# Patient Record
Sex: Female | Born: 1995 | Race: Black or African American | Hispanic: No | Marital: Single | Smoking: Never smoker
Health system: Southern US, Community
[De-identification: ages and names within clinical notes are randomized; demographics above are authoritative.]

## PROBLEM LIST (undated history)

## (undated) HISTORY — PX: TONSILLECTOMY: SUR1361

---

## 2016-07-23 ENCOUNTER — Emergency Department (HOSPITAL_COMMUNITY)
Admission: EM | Admit: 2016-07-23 | Discharge: 2016-07-23 | Disposition: A | Discharge status: Home / Self Care | Payer: Medicaid Other | Attending: Emergency Medicine | Admitting: Emergency Medicine

## 2016-07-23 ENCOUNTER — Encounter (HOSPITAL_COMMUNITY): Payer: Self-pay

## 2016-07-23 DIAGNOSIS — J029 Acute pharyngitis, unspecified: Secondary | ICD-10-CM | POA: Insufficient documentation

## 2016-07-23 LAB — RAPID STREP SCREEN (MED CTR MEBANE ONLY): Streptococcus, Group A Screen (Direct): NEGATIVE

## 2016-07-23 MED ORDER — CLINDAMYCIN HCL 300 MG PO CAPS: 300.0000 mg | ORAL_CAPSULE | Freq: Three times a day (TID) | ORAL | 0 refills | Status: DC

## 2016-07-23 NOTE — ED Provider Notes (Signed)
  [facility]-EMERGENCY DEPT Provider Note   CSN: [id number] Arrival date & time: 07/23/16  1458     History   Chief Complaint Chief Complaint  Patient presents with  . Sore Throat    HPI Andrea Hamilton is a 21 y.o. female.  HPI Pt presents with 24 hours of worsening sorethroat without fever. No vomiting. Recent viral URI. No neck pain or dental pain. Pain is moderate in severity.     History reviewed. No pertinent past medical history.  There are no active problems to display for this patient.   History reviewed. No pertinent surgical history.  OB History    No data available       Home Medications    Prior to Admission medications   Medication Sig Start Date End Date Taking? Authorizing Provider  clindamycin (CLEOCIN) 300 MG capsule Take 1 capsule (300 mg total) by mouth 3 (three) times daily. 07/23/16   Azalia BilisKevin Brylan Seubert, MD    Family History No family history on file.  Social History Social History  Substance Use Topics  . Smoking status: Never Smoker  . Smokeless tobacco: Never Used  . Alcohol use No     Allergies   Patient has no allergy information on record.   Review of Systems Review of Systems  All other systems reviewed and are negative.    Physical Exam Updated Vital Signs BP 129/88 (BP Location: Left Arm)   Pulse 67   Temp 98.7 F (37.1 C) (Oral)   Resp 16   SpO2 100%   Physical Exam  Constitutional: She is oriented to person, place, and time. She appears well-developed and well-nourished.  HENT:  Head: Normocephalic.  Posterior pharynx erythema with tonsillar swelling and exudate. No signs of PTA. Tolerating secretions. Speech normal  Eyes: EOM are normal.  Neck: Normal range of motion.  Pulmonary/Chest: Effort normal.  Abdominal: She exhibits no distension.  Musculoskeletal: Normal range of motion.  Neurological: She is alert and oriented to person, place, and time.  Psychiatric: She has a normal mood and affect.  Nursing note and  vitals reviewed.    ED Treatments / Results  Labs (all labs ordered are listed, but only abnormal results are displayed) Labs Reviewed  RAPID STREP [facility]CREEN (NOT [facility])  CULTURE, GROUP A STREP [facility])    EKG  EKG Interpretation None       Radiology No results found.  Procedures Procedures (including critical care time)  Medications Ordered in ED Medications - No data to display   Initial Impression / Assessment and Plan / ED Course  I have reviewed the triage vital signs and the nursing notes.  Pertinent labs & imaging results that were available during my care of the patient were reviewed by me and considered in my medical decision making (see chart for details).     Acute tonsillitis. Home with clindamycin. Nsaids. Fluids. No signs of PTA  Final Clinical Impressions(s) / ED Diagnoses   Final diagnoses:  Pharyngitis, unspecified etiology    New Prescriptions New Prescriptions   CLINDAMYCIN (CLEOCIN) 300 MG CAPSULE    Take 1 capsule (300 mg total) by mouth 3 (three) times daily.     Azalia BilisKevin Inza Mikrut, MD 07/23/16 [phone]

## 2016-07-23 NOTE — ED Triage Notes (Addendum)
Pt reports sore throat and states she has white patches in the back of her throat. Tonsils appear enlarged. She also reports pain with swallowing food; onset yesterday.

## 2016-07-26 LAB — CULTURE, GROUP A STREP (THRC)

## 2016-11-22 DIAGNOSIS — J3501 Chronic tonsillitis: Secondary | ICD-10-CM | POA: Insufficient documentation

## 2016-11-22 DIAGNOSIS — J0391 Acute recurrent tonsillitis, unspecified: Secondary | ICD-10-CM | POA: Insufficient documentation

## 2017-04-13 ENCOUNTER — Encounter (HOSPITAL_COMMUNITY): Payer: Self-pay

## 2017-04-13 ENCOUNTER — Other Ambulatory Visit: Payer: Self-pay

## 2017-04-13 ENCOUNTER — Emergency Department (HOSPITAL_COMMUNITY)
Admission: EM | Admit: 2017-04-13 | Discharge: 2017-04-13 | Disposition: A | Discharge status: Home / Self Care | Payer: Self-pay | Attending: Emergency Medicine | Admitting: Emergency Medicine

## 2017-04-13 DIAGNOSIS — M25512 Pain in left shoulder: Secondary | ICD-10-CM | POA: Insufficient documentation

## 2017-04-13 MED ORDER — CYCLOBENZAPRINE HCL 10 MG PO TABS: 10.0000 mg | ORAL_TABLET | Freq: Every evening | ORAL | 0 refills | Status: DC | PRN

## 2017-04-13 MED ORDER — NAPROXEN 500 MG PO TABS: 500.0000 mg | ORAL_TABLET | Freq: Two times a day (BID) | ORAL | 0 refills | Status: DC

## 2017-04-13 NOTE — ED Triage Notes (Signed)
Pt reports she was exercise last night and has had left should pain since stating "I think I pulled something".

## 2017-04-13 NOTE — Discharge Instructions (Signed)
Please read and follow all provided instructions.  You have been seen today for left shoulder pain  Tests performed today include: Vital signs. See below for your results today.   Exam was concerning for rotator cuff tendinitis versus biceps tendinitis.  These follow below instructions. Home care instructions: -- *PRICE in the first 24-48 hours after injury: Protect (with brace, splint, sling), if given by your provider -these take your shoulder out of the sling at least once per day and perform shoulder range of motion exercises.  This is to prevent stiffening of the shoulder. Rest Ice- Do not apply ice pack directly to your skin, place towel or similar between your skin and ice/ice pack. Apply ice for 20 min, then remove for 40 min while awake Compression- Wear brace, elastic bandage, splint as directed by your provider Elevate affected extremity above the level of your heart when not walking around for the first 24-48 hours   Use naproxen 500 mg twice daily with food for pain.  He can take muscle relaxers as prescribed at night.  This medication can cause you to become drowsy.  Do not drive or operate heavy machinery while taking this medication.  Follow-up instructions: Please follow-up with your primary care provider or the provided orthopedic physician (bone specialist) if you continue to have significant pain in 1 week. In this case you may have a more severe injury that requires further care.   Return instructions:  Please return if your toes or feet are numb or tingling, appear gray or blue, or you have severe pain (also elevate the leg and loosen splint or wrap if you were given one) Please return to the Emergency Department if you experience worsening symptoms.  Please return if you have any other emergent concerns. Additional Information:  Your vital signs today were: BP (!) 149/101 (BP Location: Right Arm)    Pulse 93    Temp 98.3 F (36.8 C) (Oral)    Resp 16    Ht 5\' 5"   (1.651 m)    Wt 68 kg (150 lb)    SpO2 100%    BMI 24.96 kg/m  If your blood pressure (BP) was elevated above 135/85 this visit, please have this repeated by your doctor within one month. ---------------

## 2017-04-13 NOTE — ED Provider Notes (Signed)
[facility]   CSN: [id number] Arrival date & time: 04/13/17  1029     History   Chief Complaint Chief Complaint  Patient presents with  . Shoulder Pain    HPI Andrea Hamilton is a 21 y.o. female with no significant past medical history who presents to the emergency department today for left shoulder pain.  Patient states that she was at the gym yesterday performing bicep curls, shoulder press and bent over rows.  About an hour after the gym she started noticing pain and soreness around the anterior and posterior left shoulder without radiation.  She denies any neck pain.  Since then the patient has had pain with movement of the arm and also when lying on the left shoulder.  She denies any trauma at the gym or prior to the onset of pain.  She denies any weakness, numbness or tingling.  She has not taken anything for this.  No history of past shoulder injuries or surgeries.    HPI  History reviewed. No pertinent past medical history.  There are no active problems to display for this patient.   Past Surgical History:  Procedure Laterality Date  . TONSILLECTOMY      OB History    No data available       Home Medications    Prior to Admission medications   Medication Sig Start Date End Date Taking? Authorizing Provider  clindamycin (CLEOCIN) 300 MG capsule Take 1 capsule (300 mg total) by mouth 3 (three) times daily. 07/23/16   Azalia Bilisampos, Kevin, MD    Family History No family history on file.  Social History Social History   Tobacco Use  . Smoking status: Never Smoker  . Smokeless tobacco: Never Used  Substance Use Topics  . Alcohol use: No  . Drug use: No     Allergies   Patient has no allergy information on record.   Review of Systems Review of Systems  Constitutional: Negative for chills and fever.  Musculoskeletal: Positive for arthralgias. Negative for joint swelling and neck pain.  Skin: Negative for  wound.  Neurological: Negative for weakness and numbness.     Physical Exam Updated Vital Signs BP (!) 149/101 (BP Location: Right Arm)   Pulse 93   Temp 98.3 F (36.8 C) (Oral)   Resp 16   Ht 5\' 5"  (1.651 m)   Wt 68 kg (150 lb)   SpO2 100%   BMI 24.96 kg/m   Physical Exam  Constitutional: She appears well-developed and well-nourished.  HENT:  Head: Normocephalic and atraumatic.  Right Ear: External ear normal.  Left Ear: External ear normal.  Eyes: Conjunctivae are normal. Right eye exhibits no discharge. Left eye exhibits no discharge. No scleral icterus.  Pulmonary/Chest: Effort normal. No respiratory distress.  Musculoskeletal:  Cervical Spine: Appearance normal. No obvious bony deformity. No skin swelling, erythema, heat, fluctuance or break of the skin. No TTP over the cervical spinous processes. No paraspinal tenderness. No step-offs. Patient is able to actively rotate their neck 45 degrees left and right voluntarily without pain and flex and extend the neck without pain.  Negative Spurling's and Cervical Load test.  Left Shoulder: Appearance normal. No obvious bony deformity. No skin swelling, erythema, heat, fluctuance or break of the skin. No clavicular deformity or TTP. TTP over proximal biceps tendon/bicipital groove, posterior deltoid. Active flexion to 90 limited 2/2 to pain, abduction to 90 with limitation 2/2 to pain. Intact int/ext rotation. Normal extension.  Patient with pain during strength testing for flexion, extension, abduction, adduction, and internal/external rotation intact and appropriate for age. Positive speed's test. Negative drop arms test. Left  Elbow: Appearance normal. No obvious bony deformity. No skin swelling, erythema, heat, fluctuance or break of the skin. No TTP over joint. Active flexion, extension, supination and pronation full and intact without pain. Strength able and appropriate for age for flexion and extension.  Radial Pulse 2+. Cap refill  <2 seconds. SILT for M/U/R distributions. Compartments soft.   Neurological: She is alert.  Skin: No pallor.  Psychiatric: She has a normal mood and affect.  Nursing note and vitals reviewed.    ED Treatments / Results  Labs (all labs ordered are listed, but only abnormal results are displayed) Labs Reviewed - No data to display  EKG  EKG Interpretation None       Radiology No results found.  Procedures Procedures (including critical care time) SPLINT APPLICATION Date/Time: 12:59 PM Authorized by: Jacinto HalimMichael M Maczis Consent: Verbal consent obtained. Risks and benefits: risks, benefits and alternatives were discussed Consent given by: patient Splint applied by: orthopedic technician Location details: Left shoulder Splint type: Shoulder sling Supplies used: shoulder sling Post-procedure: The splinted body part was neurovascularly unchanged following the procedure. Patient tolerance: Patient tolerated the procedure well with no immediate complications.    Medications Ordered in ED Medications - No data to display   Initial Impression / Assessment and Plan / ED Course  I have reviewed the triage vital signs and the nursing notes.  Pertinent labs & imaging results that were available during my care of the patient were reviewed by me and considered in my medical decision making (see chart for details).     21 year old left hand dominant female with shoulder pain after performing lifting exercise in the gym yesterday.  Exam consistent with a bicep tendinitis versus rotator cuff tendinitis.  Do not feel x-ray is warranted at this time.  Will have the patient wear shoulder sling, follow-up price therapy and take NSAIDs/muscle relaxers.  She is to take her arm out of the shoulder sling multiple times per day and perform shoulder range of motion exercises in order to prevent frozen shoulder.  She is to follow-up with PCP versus orthopedics in 1 week for continued pain.  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  Patient given strict return precautions.  She is in agreement with plan and appears safe for discharge.  Final Clinical Impressions(s) / ED Diagnoses   Final diagnoses:  Acute pain of left shoulder    ED Discharge Orders        Ordered    cyclobenzaprine (FLEXERIL) 10 MG tablet  At bedtime PRN     04/13/17 1303    naproxen (NAPROSYN) 500 MG tablet  2 times daily     04/13/17 1303       Princella PellegriniMaczis, Michael M, PA-C 04/13/17 1312    Doug SouJacubowitz, Sam, MD 04/13/17 1714

## 2017-09-30 ENCOUNTER — Emergency Department (HOSPITAL_COMMUNITY): Payer: Medicaid Other

## 2017-09-30 ENCOUNTER — Encounter (HOSPITAL_COMMUNITY): Payer: Self-pay

## 2017-09-30 ENCOUNTER — Emergency Department (HOSPITAL_COMMUNITY)
Admission: EM | Admit: 2017-09-30 | Discharge: 2017-10-01 | Disposition: A | Discharge status: Home / Self Care | Payer: Medicaid Other | Attending: Emergency Medicine | Admitting: Emergency Medicine

## 2017-09-30 ENCOUNTER — Other Ambulatory Visit: Payer: Self-pay

## 2017-09-30 DIAGNOSIS — E876 Hypokalemia: Secondary | ICD-10-CM | POA: Insufficient documentation

## 2017-09-30 DIAGNOSIS — R079 Chest pain, unspecified: Secondary | ICD-10-CM | POA: Insufficient documentation

## 2017-09-30 LAB — COMPREHENSIVE METABOLIC PANEL
ALBUMIN: 4.6 g/dL (ref 3.5–5.0)
ALK PHOS: 70 U/L (ref 38–126)
ALT: 24 U/L (ref 14–54)
AST: 25 U/L (ref 15–41)
Anion gap: 10 (ref 5–15)
BILIRUBIN TOTAL: 0.5 mg/dL (ref 0.3–1.2)
BUN: 12 mg/dL (ref 6–20)
CALCIUM: 9.7 mg/dL (ref 8.9–10.3)
CO2: 24 mmol/L (ref 22–32)
CREATININE: 0.88 mg/dL (ref 0.44–1.00)
Chloride: 108 mmol/L (ref 101–111)
GFR calc Af Amer: >60 mL/min (ref >60)
GLUCOSE: 105 mg/dL — AB (ref 65–99)
POTASSIUM: 3.3 mmol/L — AB (ref 3.5–5.1)
Sodium: 142 mmol/L (ref 135–145)
TOTAL PROTEIN: 8.9 g/dL — AB (ref 6.5–8.1)

## 2017-09-30 LAB — URINALYSIS, ROUTINE W REFLEX MICROSCOPIC
Bacteria, UA: NONE SEEN
Bilirubin Urine: NEGATIVE
Glucose, UA: NEGATIVE mg/dL
Hgb urine dipstick: NEGATIVE
Ketones, ur: NEGATIVE mg/dL
Nitrite: NEGATIVE
Protein, ur: NEGATIVE mg/dL
Specific Gravity, Urine: 1.029 (ref 1.005–1.030)
pH: 5 (ref 5.0–8.0)

## 2017-09-30 LAB — CBC
HEMATOCRIT: 43.8 % (ref 36.0–46.0)
Hemoglobin: 15.1 g/dL — ABNORMAL HIGH (ref 12.0–15.0)
MCH: 31 pg (ref 26.0–34.0)
MCHC: 34.5 g/dL (ref 30.0–36.0)
MCV: 89.9 fL (ref 78.0–100.0)
PLATELETS: 266 10*3/uL (ref 150–400)
RBC: 4.87 MIL/uL (ref 3.87–5.11)
RDW: 11.8 % (ref 11.5–15.5)
WBC: 6.1 10*3/uL (ref 4.0–10.5)

## 2017-09-30 LAB — LIPASE, BLOOD: Lipase: 33 U/L (ref 11–51)

## 2017-09-30 LAB — I-STAT TROPONIN, ED: Troponin i, poc: 0 ng/mL (ref 0.00–0.08)

## 2017-09-30 LAB — I-STAT BETA HCG BLOOD, ED (MC, WL, AP ONLY)

## 2017-09-30 MED ORDER — KETOROLAC TROMETHAMINE 30 MG/ML IJ SOLN
30.0000 mg | Freq: Once | INTRAMUSCULAR | Status: AC
  Administered 2017-10-01: 30 mg via INTRAVENOUS
  Filled 2017-09-30: qty 1

## 2017-09-30 MED ORDER — POTASSIUM CHLORIDE CRYS ER 20 MEQ PO TBCR
40.0000 meq | EXTENDED_RELEASE_TABLET | Freq: Once | ORAL | Status: AC
  Administered 2017-10-01: 40 meq via ORAL
  Filled 2017-09-30: qty 2

## 2017-09-30 MED ORDER — METHOCARBAMOL 500 MG PO TABS
500.0000 mg | ORAL_TABLET | Freq: Once | ORAL | Status: AC
  Administered 2017-10-01: 500 mg via ORAL
  Filled 2017-09-30: qty 1

## 2017-09-30 NOTE — ED Triage Notes (Signed)
Patient presents with sudden onset of "sharp stabbing" pain to her left sided chest at 1900, while at work. Patient denies injury/falls. Patient endorses increased chest pain with radiation to her midline chest. Patient denies cough/fever. Patient denies abdominal pain. Patient denies SOB.

## 2017-09-30 NOTE — ED Notes (Signed)
Bed: WA04 Expected date:  Expected time:  Means of arrival:  Comments: 

## 2017-09-30 NOTE — ED Provider Notes (Signed)
[facility]-EMERGENCY DEPT Provider Note   CSN: [id number] Arrival date & time: 09/30/17  2017     History   Chief Complaint Chief Complaint  Patient presents with  . Abdominal Pain    HPI Andrea Hamilton is a 22 y.o. female with a hx of no major medical problems presents to the Emergency Department complaining of gradual, persistent, progressively worsening chest pain and SOB onset 7pm.  Pt reports the pain is sharp, rated at a 7/10.  Pt reports the pain is under her left breast and radiates to the middle of her chest.  Movement and breathing makes it worse.  Pt denies personal or family cardiac hx.  Pt reports she is very anxious about this.  No abd pain.  Pt denies known trauma, falls or injury.  Pt denies fever, chills, headache, neck pain, abd pain, N/V/D, weakness, dizziness, syncope, dysuria, leg swelling.  Pt denies hx DVT, BC tablets, travel, leg swelling, injury or surgery.  Pt denies EtOH or smoking.  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 She denies change or increase in activity or heavy lifting.      The history is provided by the patient and medical records. No language interpreter was used.    No past medical history on file.  There are no active problems to display for this patient.   Past Surgical History:  Procedure Laterality Date  . TONSILLECTOMY       OB History   None      Home Medications    Prior to Admission medications   Medication Sig Start Date End Date Taking? Authorizing Provider  methocarbamol (ROBAXIN) 500 MG tablet Take 1 tablet (500 mg total) by mouth 2 (two) times daily. 10/01/17   Belton Peplinski, Dahlia Client, PA-C  naproxen (NAPROSYN) 500 MG tablet Take 1 tablet (500 mg total) by mouth 2 (two) times daily with a meal. 10/01/17   Ormond Lazo, Dahlia Client, PA-C    Family History No family history on file.  Social History Social History   Tobacco Use  . Smoking status: Never Smoker  .  Smokeless tobacco: Never Used  Substance Use Topics  . Alcohol use: No  . Drug use: No     Allergies   Patient has no known allergies.   Review of Systems Review of Systems  Constitutional: Negative for appetite change, diaphoresis, fatigue, fever and unexpected weight change.  HENT: Negative for mouth sores.   Eyes: Negative for visual disturbance.  Respiratory: Positive for shortness of breath. Negative for cough, chest tightness and wheezing.   Cardiovascular: Positive for chest pain.  Gastrointestinal: Negative for abdominal pain, constipation, diarrhea, nausea and vomiting.  Endocrine: Negative for polydipsia, polyphagia and polyuria.  Genitourinary: Negative for dysuria, frequency, hematuria and urgency.  Musculoskeletal: Negative for back pain and neck stiffness.  Skin: Negative for rash.  Allergic/Immunologic: Negative for immunocompromised state.  Neurological: Negative for syncope, light-headedness and headaches.  Hematological: Does not bruise/bleed easily.  Psychiatric/Behavioral: Negative for sleep disturbance. The patient is not nervous/anxious.      Physical Exam Updated Vital Signs BP (!) 147/95 (BP Location: Left Arm)   Pulse (!) 107   Temp 98.4 F (36.9 C) (Oral)   Resp 20   Ht  (1.651 m)   Wt 68 kg (150 lb)   SpO2 100%   BMI 24.96 kg/m   Physical Exam  Constitutional: She appears well-developed and well-nourished. No distress.  Awake, alert, nontoxic  appearance  HENT:  Head: Normocephalic and atraumatic.  Mouth/Throat: Oropharynx is clear and moist. No oropharyngeal exudate.  Eyes: Conjunctivae are normal. No scleral icterus.  Neck: Normal range of motion. Neck supple.  Cardiovascular: Normal rate, regular rhythm and intact distal pulses.  Pulses:      Radial pulses are 2+ on the right side, and 2+ on the left side.       Dorsalis pedis pulses are 2+ on the right side, and 2+ on the left side.  Capillary refill < 3 sec  Pulmonary/Chest:  Effort normal and breath sounds normal. No respiratory distress. She has no wheezes. She exhibits tenderness.  Equal chest expansion TTP under the left breast    Abdominal: Soft. Bowel sounds are normal. She exhibits no mass. There is no tenderness. There is no rebound and no guarding.  Musculoskeletal: Normal range of motion. She exhibits no edema.  Neurological: She is alert.  Speech is clear and goal oriented Moves extremities without ataxia  Skin: Skin is warm and dry. She is not diaphoretic.  Psychiatric: She has a normal mood and affect.  Nursing note and vitals reviewed.    ED Treatments / Results  Labs (all labs ordered are listed, but only abnormal results are displayed) Labs Reviewed  COMPREHENSIVE METABOLIC PANEL - Abnormal; Notable for the following components:      Result Value   Potassium 3.3 (*)    Glucose, Bld 105 (*)    Total Protein 8.9 (*)    All other components within normal limits  CBC - Abnormal; Notable for the following components:   Hemoglobin 15.1 (*)    All other components within normal limits  URINALYSIS, ROUTINE W REFLEX MICROSCOPIC - Abnormal; Notable for the following components:   APPearance HAZY (*)    Leukocytes, UA TRACE (*)    All other components within normal limits  LIPASE, BLOOD  D-DIMER, QUANTITATIVE (NOT AT [facility])  I-STAT BETA HCG BLOOD, ED ([facility], [facility], [facility] ONLY)  I-STAT TROPONIN, ED  I-STAT BETA HCG BLOOD, ED ([facility], [facility], [facility] ONLY)    EKG EKG Interpretation  Date/Time:  Friday Sep 30 2017 21:13:39 EDT Ventricular Rate:  96 PR Interval:    QRS Duration: 64 QT Interval:  315 QTC Calculation: 398 R Axis:   76 Text Interpretation:  Sinus rhythm No old tracing to compare Confirmed by Rolan Bucco [phone]) on 09/30/2017 11:31:46 PM   Radiology Dg Chest 2 View  Result Date: 09/30/2017 CLINICAL DATA:  Sudden onset chest pain. EXAM: CHEST - 2 VIEW COMPARISON:  None. FINDINGS: The heart size and mediastinal contours are within normal  limits. Both lungs are clear. The visualized skeletal structures are unremarkable. IMPRESSION: Normal chest x-ray. Electronically Signed   By: Obie Dredge M.D.   On: 09/30/2017 21:27    Procedures Procedures (including critical care time)  Medications Ordered in ED Medications  potassium chloride SA (K-DUR,KLOR-CON) CR tablet 40 mEq (40 mEq Oral Given 10/01/17 0036)  methocarbamol (ROBAXIN) tablet 500 mg (500 mg Oral Given 10/01/17 0037)  ketorolac (TORADOL) 30 MG/ML injection 30 mg (30 mg Intravenous Given 10/01/17 0033)     Initial Impression / Assessment and Plan / ED Course  I have reviewed the triage vital signs and the nursing notes.  Pertinent labs & imaging results that were available during my care of the patient were reviewed by me and considered in my medical decision making (see chart for details).  Clinical Course as of Oct 02 126  Fri Sep 30, 2017  2323 Tachycardic on arrival, not on physical exam  Pulse Rate(!): 107 [HM]  2323 Mild hypokalemia, will replace in the ED  Potassium(!): 3.3 [HM]    Clinical Course User Index [HM] Jaymar Loeber, Dahlia Client, PA-C    Patient presents with left-sided chest pain onset earlier today.  Initially she is tachycardic however this resolves completely.  EKG is without acute ischemia.  Troponin is negative.  No evidence of acute coronary syndrome.  Chest x-ray is without evidence of pneumonia, pneumothorax or pulmonary edema.  Personally evaluated these images.  No widened mediastinum or evidence of mediastinal air.  No evidence of Boerhaave's.  Patient without recent viral illness and negative troponin.  No evidence of myocarditis.  Less likely to be pericarditis given history.  She does have some shortness of breath.  No risk factors for DVT.  Negative d-dimer.  Highly doubt pulmonary embolism.  Patient will be discharged home with conservative therapies.  Suspect muscle strain versus early zoster.  No rash to indicate zoster.  Patient was  vaccinated against chickenpox.  Discussed reasons to return immediately to the emergency department.  Also discussed close follow-up with primary care physician.  Patient states understanding and is in agreement with the plan.  Final Clinical Impressions(s) / ED Diagnoses   Final diagnoses:  Left-sided chest pain  Hypokalemia    ED Discharge Orders        Ordered    naproxen (NAPROSYN) 500 MG tablet  2 times daily with meals     10/01/17 0124    methocarbamol (ROBAXIN) 500 MG tablet  2 times daily     10/01/17 0124       Yusuke Beza, Dahlia Client, PA-C 10/01/17 0128    Rolan Bucco, MD 10/01/17 1406

## 2017-10-01 LAB — D-DIMER, QUANTITATIVE: D-Dimer, Quant: 0.3 ug/mL-FEU (ref 0.00–0.50)

## 2017-10-01 MED ORDER — METHOCARBAMOL 500 MG PO TABS
500.0000 mg | ORAL_TABLET | Freq: Two times a day (BID) | ORAL | 0 refills | Status: DC
Start: 2017-10-01 — End: 2020-03-27

## 2017-10-01 MED ORDER — NAPROXEN 500 MG PO TABS: 500.0000 mg | ORAL_TABLET | Freq: Two times a day (BID) | ORAL | 0 refills | Status: DC

## 2017-10-01 NOTE — Discharge Instructions (Addendum)
1. Medications: naprosyn, robaxin, usual home medications 2. Treatment: rest, drink plenty of fluids, gentle stretching, warm heat 3. Follow Up: Please followup with your primary doctor in 1-2 days for discussion of your diagnoses and further evaluation after today's visit; if you do not have a primary care doctor use the resource guide provided to find one; Please return to the ER for development of rash, worsening pain, vomiting, fevers, worsening SOB or other concerns

## 2017-10-01 NOTE — ED Notes (Signed)
Attempted to place PIV unsuccessful to Left AC. 2nd RN to attempt

## 2019-04-25 ENCOUNTER — Other Ambulatory Visit: Payer: Self-pay

## 2019-04-25 DIAGNOSIS — Z20822 Contact with and (suspected) exposure to covid-19: Secondary | ICD-10-CM

## 2019-04-26 LAB — NOVEL CORONAVIRUS, NAA: SARS-CoV-2, NAA: DETECTED — AB

## 2019-05-01 ENCOUNTER — Telehealth: Payer: Self-pay

## 2019-05-01 ENCOUNTER — Encounter: Payer: Self-pay | Admitting: *Deleted

## 2019-05-01 NOTE — Telephone Encounter (Signed)
[place] Dept. Was notified on 04/30/19.

## 2019-05-01 NOTE — Telephone Encounter (Signed)
Pt. returned call to discuss COVID results. Advised pt. of COVID result showing the virus "was detected", and that she can spread the germ to other people.  Reported onset of symptoms of head congestion, body aches, fever, and nausea on 04/24/19.  Reported she is starting to feel better today. Advised of CDC guidelines for self isolation/ ending isolation.  Advised of safe practice guidelines. Symptom Tier reviewed.  Advised when to seek emergency care.  Instructed to rest and hydrate well.  Advised to leave the house during recommended isolation period, only if it is necessary to seek medical care.  Advised that she should inform anyone that has has recent direct contact with her, should quarantine for 14 days.  Advised the Health Dept. will be notified.  Pt. Verb. understanding of instructions.

## 2019-09-06 ENCOUNTER — Ambulatory Visit: Payer: Medicaid Other | Attending: Internal Medicine

## 2019-09-06 DIAGNOSIS — Z23 Encounter for immunization: Secondary | ICD-10-CM

## 2019-09-06 NOTE — Progress Notes (Signed)
   Covid-19 Vaccination Clinic  Name:  Andrea Hamilton    MRN: [id number] DOB: 10/28/1995  09/06/2019  [name] was observed post Covid-19 immunization for 15 minutes without incident. She was provided with Vaccine Information Sheet and instruction to access the V-Safe system.   [name] was instructed to call 911 with any severe reactions post vaccine: Marland Kitchen Difficulty breathing  . Swelling of face and throat  . A fast heartbeat  . A bad rash all over body  . Dizziness and weakness   Immunizations Administered    Name Date Dose VIS Date Route   Pfizer COVID-19 Vaccine 09/06/2019 11:43 AM 0.3 mL 05/11/2019 Intramuscular   Manufacturer: [employer], [employer]   Lot: [id number]   NDC: [address]-9

## 2019-10-01 ENCOUNTER — Ambulatory Visit: Payer: Medicaid Other | Attending: Internal Medicine

## 2019-10-01 DIAGNOSIS — Z23 Encounter for immunization: Secondary | ICD-10-CM

## 2019-10-01 NOTE — Progress Notes (Signed)
   Covid-19 Vaccination Clinic  Name:  Becca Bayne    MRN: [id number] DOB: 1995-06-02  10/01/2019  Ms. Murata was observed post Covid-19 immunization for 15 minutes without incident. She was provided with Vaccine Information Sheet and instruction to access the V-Safe system.   Ms. Kuklinski was instructed to call 911 with any severe reactions post vaccine: Marland Kitchen Difficulty breathing  . Swelling of face and throat  . A fast heartbeat  . A bad rash all over body  . Dizziness and weakness   Immunizations Administered    Name Date Dose VIS Date Route   Pfizer COVID-19 Vaccine 10/01/2019  9:01 AM 0.3 mL 07/25/2018 Intramuscular   Manufacturer: [employer], [employer]   Lot: [id number]   NDC: [address]-6

## 2020-01-30 DIAGNOSIS — Z3042 Encounter for surveillance of injectable contraceptive: Secondary | ICD-10-CM | POA: Diagnosis not present

## 2020-03-27 ENCOUNTER — Other Ambulatory Visit: Payer: Self-pay

## 2020-03-27 ENCOUNTER — Encounter (HOSPITAL_COMMUNITY): Payer: Self-pay

## 2020-03-27 ENCOUNTER — Ambulatory Visit (HOSPITAL_COMMUNITY)
Admission: RE | Admit: 2020-03-27 | Discharge: 2020-03-27 | Disposition: A | Discharge status: Home / Self Care | Payer: 59 | Source: Ambulatory Visit | Attending: Family Medicine | Admitting: Family Medicine

## 2020-03-27 ENCOUNTER — Ambulatory Visit (INDEPENDENT_AMBULATORY_CARE_PROVIDER_SITE_OTHER): Payer: 59

## 2020-03-27 VITALS — BP 131/64 | HR 69 | Temp 99.4°F | Resp 17

## 2020-03-27 DIAGNOSIS — M549 Dorsalgia, unspecified: Secondary | ICD-10-CM

## 2020-03-27 DIAGNOSIS — R079 Chest pain, unspecified: Secondary | ICD-10-CM | POA: Diagnosis not present

## 2020-03-27 DIAGNOSIS — M940 Chondrocostal junction syndrome [Tietze]: Secondary | ICD-10-CM

## 2020-03-27 MED ORDER — IBUPROFEN 600 MG PO TABS: 600.0000 mg | ORAL_TABLET | Freq: Three times a day (TID) | ORAL | 0 refills | Status: AC | PRN

## 2020-03-27 NOTE — Discharge Instructions (Addendum)
Your x ray was normal I believe this is costochondritis We will treat this with 600 ibuprofen every 8 hours for the next 7 to 10 days Rest Follow up as needed for continued or worsening symptoms

## 2020-03-27 NOTE — ED Triage Notes (Signed)
Pt c/o sternum pain x 2-3 weeks. She states she was stretching and felt like a pop she states that it has happened more than once. She states the pain readiates to her back as well. Pt states that the pain keeps from lying flat.

## 2020-03-28 NOTE — ED Provider Notes (Signed)
[facility]    CSN: [id number] Arrival date & time: 03/27/20  1443      History   Chief Complaint Chief Complaint  Patient presents with  . Chest Pain  . Appointment    HPI Andrea Hamilton is a 24 y.o. female.   Patient is a 24 year old female who presents today with sternal chest pain.  This is been off and on over the past 2 to 3 weeks.  Reporting was stretching and felt a pop in the sternal area.  Has not take anything for her pain.  Pain radiates to her back at times.  Pain is worse with certain movements and breathing.  No cough, congestion.  No recent injuries.     History reviewed. No pertinent past medical history.  There are no problems to display for this patient.   Past Surgical History:  Procedure Laterality Date  . TONSILLECTOMY      OB History   No obstetric history on file.      Home Medications    Prior to Admission medications   Medication Sig Start Date End Date Taking? Authorizing Provider  ibuprofen (ADVIL) 600 MG tablet Take 1 tablet (600 mg total) by mouth every 8 (eight) hours as needed for moderate pain. 03/27/20   Janace Aris, NP    Family History History reviewed. No pertinent family history.  Social History Social History   Tobacco Use  . Smoking status: Never Smoker  . Smokeless tobacco: Never Used  Vaping Use  . Vaping Use: Never used  Substance Use Topics  . Alcohol use: No  . Drug use: No     Allergies   Patient has no known allergies.   Review of Systems Review of Systems   Physical Exam Triage Vital Signs ED Triage Vitals  Enc Vitals Group     BP 03/27/20 1529 131/64     Pulse Rate 03/27/20 1529 69     Resp 03/27/20 1529 17     Temp 03/27/20 1529 99.4 F (37.4 C)     Temp Source 03/27/20 1529 Oral     SpO2 03/27/20 1529 99 %     Weight --      Height --      Head Circumference --      Peak Flow --      Pain Score 03/27/20 1525 6     Pain Loc --      Pain Edu? --      Excl. in GC? --     No data found.  Updated Vital Signs BP 131/64 (BP Location: Left Arm)   Pulse 69   Temp 99.4 F (37.4 C) (Oral)   Resp 17   SpO2 99%   Visual Acuity Right Eye Distance:   Left Eye Distance:   Bilateral Distance:    Right Eye Near:   Left Eye Near:    Bilateral Near:     Physical Exam Vitals and nursing note reviewed.  Constitutional:      General: She is not in acute distress.    Appearance: Normal appearance. She is not ill-appearing, toxic-appearing or diaphoretic.  HENT:     Head: Normocephalic.     Nose: Nose normal.  Eyes:     Conjunctiva/sclera: Conjunctivae normal.  Cardiovascular:     Rate and Rhythm: Normal rate and regular rhythm.  Pulmonary:     Effort: Pulmonary effort is normal.     Breath sounds: Normal breath sounds.  Chest:  Musculoskeletal:        General: Normal range of motion.     Cervical back: Normal range of motion.  Skin:    General: Skin is warm and dry.     Findings: No rash.  Neurological:     Mental Status: She is alert.  Psychiatric:        Mood and Affect: Mood normal.      UC Treatments / Results  Labs (all labs ordered are listed, but only abnormal results are displayed) Labs Reviewed - No data to display  EKG   Radiology DG Sternum  Result Date: 03/27/2020 CLINICAL DATA:  Pt c/o sternum pain x 2-3 weeks. She states she was stretching and felt like a pop she states that it has happened more than once. She states the pain readiates to her back as well. Pt states that the pain keeps from lying flat. sternal pain x 3 weeks EXAM: STERNUM - 2+ VIEW COMPARISON:  None. FINDINGS: Normal contour of the sternum. No evidence of trauma or bony abnormality otherwise. Normal mediastinum and cardiac silhouette. Normal pulmonary vasculature. No evidence of effusion, infiltrate, or pneumothorax. No acute bony abnormality. IMPRESSION: Normal chest radiograph and sternum. Electronically Signed   By: Genevive Bi M.D.   On:  03/27/2020 16:05    Procedures Procedures (including critical care time)  Medications Ordered in UC Medications - No data to display  Initial Impression / Assessment and Plan / UC Course  I have reviewed the triage vital signs and the nursing notes.  Pertinent labs & imaging results that were available during my care of the patient were reviewed by me and considered in my medical decision making (see chart for details).     Costochondritis Recommended 600 and ibuprofen every 8 hours for the next 7 to 10 days.  Rest. X-ray without any acute findings. Follow up as needed for continued or worsening symptoms  Final Clinical Impressions(s) / UC Diagnoses   Final diagnoses:  Costochondritis     Discharge Instructions     Your x ray was normal I believe this is costochondritis We will treat this with 600 ibuprofen every 8 hours for the next 7 to 10 days Rest Follow up as needed for continued or worsening symptoms     ED Prescriptions    Medication Sig Dispense Auth. Provider   ibuprofen (ADVIL) 600 MG tablet Take 1 tablet (600 mg total) by mouth every 8 (eight) hours as needed for moderate pain. 30 tablet Dahlia Byes A, NP     PDMP not reviewed this encounter.   Janace Aris, NP 03/28/20 [phone]

## 2020-04-18 DIAGNOSIS — Z3042 Encounter for surveillance of injectable contraceptive: Secondary | ICD-10-CM | POA: Diagnosis not present

## 2020-05-22 DIAGNOSIS — Z20822 Contact with and (suspected) exposure to covid-19: Secondary | ICD-10-CM | POA: Diagnosis not present

## 2020-06-27 DIAGNOSIS — Z3042 Encounter for surveillance of injectable contraceptive: Secondary | ICD-10-CM | POA: Diagnosis not present

## 2020-09-05 DIAGNOSIS — Z3042 Encounter for surveillance of injectable contraceptive: Secondary | ICD-10-CM | POA: Diagnosis not present

## 2020-11-11 ENCOUNTER — Other Ambulatory Visit: Payer: 59

## 2020-11-11 ENCOUNTER — Ambulatory Visit: Payer: 59 | Attending: Critical Care Medicine

## 2020-11-11 DIAGNOSIS — Z20822 Contact with and (suspected) exposure to covid-19: Secondary | ICD-10-CM

## 2020-11-11 DIAGNOSIS — Z7689 Persons encountering health services in other specified circumstances: Secondary | ICD-10-CM | POA: Diagnosis not present

## 2020-11-13 ENCOUNTER — Telehealth: Payer: Self-pay

## 2020-11-13 NOTE — Telephone Encounter (Signed)
Patient advise that covid result and still pending.

## 2020-11-14 LAB — SARS-COV-2, NAA 2 DAY TAT

## 2020-11-14 LAB — NOVEL CORONAVIRUS, NAA: SARS-CoV-2, NAA: NOT DETECTED

## 2020-11-18 DIAGNOSIS — Z3042 Encounter for surveillance of injectable contraceptive: Secondary | ICD-10-CM | POA: Diagnosis not present

## 2021-02-02 ENCOUNTER — Encounter: Payer: Self-pay | Admitting: Nurse Practitioner

## 2021-02-02 ENCOUNTER — Telehealth: Payer: 59 | Admitting: Nurse Practitioner

## 2021-02-02 DIAGNOSIS — Z8709 Personal history of other diseases of the respiratory system: Secondary | ICD-10-CM

## 2021-02-02 DIAGNOSIS — J029 Acute pharyngitis, unspecified: Secondary | ICD-10-CM | POA: Diagnosis not present

## 2021-02-02 MED ORDER — AZITHROMYCIN 250 MG PO TABS: ORAL_TABLET | ORAL | 0 refills | Status: AC

## 2021-02-02 NOTE — Progress Notes (Signed)
E-Visit for Sore Throat - Strep Symptoms  We are sorry that you are not feeling well.  Here is how we plan to help!  Due to your chronic history of recurrent strep we will go forward with treatment today. It is a good idea to take a repeat COVID test tomorrow to assure these symptoms are not related to COVID. Recently patients have had delayed positive testing with COVID so we encourage repeat testing.   Based on what you have shared with me it is likely that you have strep pharyngitis.  Strep pharyngitis is inflammation and infection in the back of the throat.  This is an infection cause by bacteria and is treated with antibiotics.  I have prescribed Azithromycin 250 mg two tablets today and then one daily for 4 additional days. For throat pain, we recommend over the counter oral pain relief medications such as acetaminophen or aspirin, or anti-inflammatory medications such as ibuprofen or naproxen sodium. Topical treatments such as oral throat lozenges or sprays may be used as needed. Strep infections are not as easily transmitted as other respiratory infections, however we still recommend that you avoid close contact with loved ones, especially the very young and elderly.  Remember to wash your hands thoroughly throughout the day as this is the number one way to prevent the spread of infection and wipe down door knobs and counters with disinfectant.   Home Care: Only take medications as instructed by your medical team. Complete the entire course of an antibiotic. Do not take these medications with alcohol. A steam or ultrasonic humidifier can help congestion.  You can place a towel over your head and breathe in the steam from hot water coming from a faucet. Avoid close contacts especially the very young and the elderly. Cover your mouth when you cough or sneeze. Always remember to wash your hands.  Get Help Right Away If: You develop worsening fever or sinus pain. You develop a severe head  ache or visual changes. Your symptoms persist after you have completed your treatment plan.  Make sure you Understand these instructions. Will watch your condition. Will get help right away if you are not doing well or get worse.   Thank you for choosing an e-visit.  Your e-visit answers were reviewed by a board certified advanced clinical practitioner to complete your personal care plan. Depending upon the condition, your plan could have included both over the counter or prescription medications.  Please review your pharmacy choice. Make sure the pharmacy is open so you can pick up prescription now. If there is a problem, you may contact your provider through [employer] and have the prescription routed to another pharmacy.  Your safety is important to [place]. If you have drug allergies check your prescription carefully.   For the next 24 hours you can use MyChart to ask questions about today's visit, request a non-urgent call back, or ask for a work or school excuse. You will get an email in the next two days asking about your experience. I hope that your e-visit has been valuable and will speed your recovery.   I spent approximately 7 minutes reviewing the patient's history, current symptoms and coordinating their plan of care today.

## 2021-02-03 IMAGING — DX DG STERNUM 2+V
3 series · 3 of 3 positions shown · non-contrast
Comparison: None.

CLINICAL DATA: Pt c/o sternum pain x 2-3 weeks. She states she was
stretching and felt like a pop she states that it has happened more
than once. She states the pain readiates to her back as well. Pt
states that the pain keeps from lying flat. sternal pain x 3 weeks

EXAM:
STERNUM - 2+ VIEW

[chest pa]
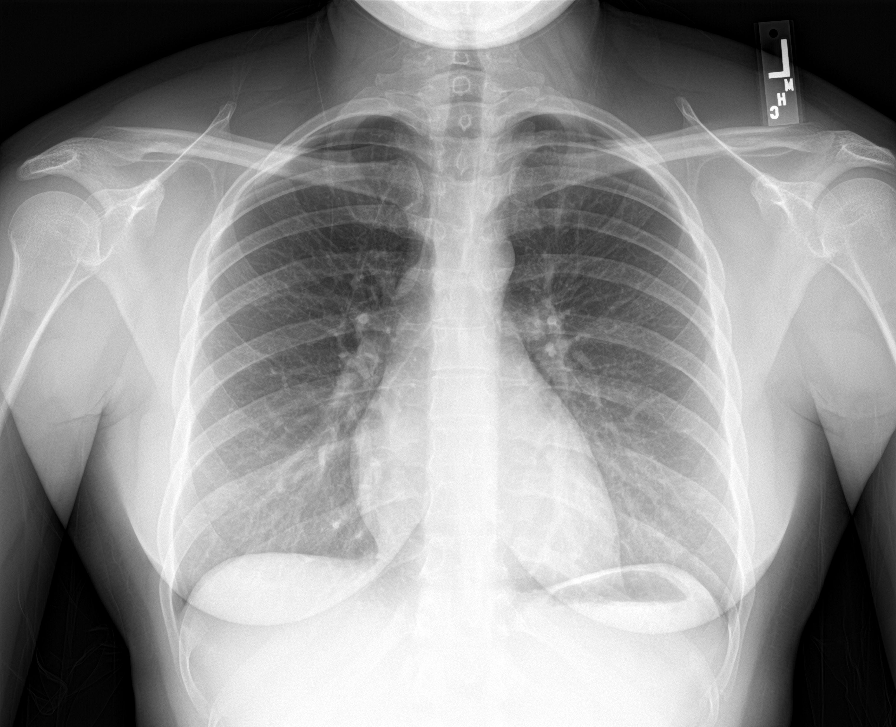

[sternum obl]
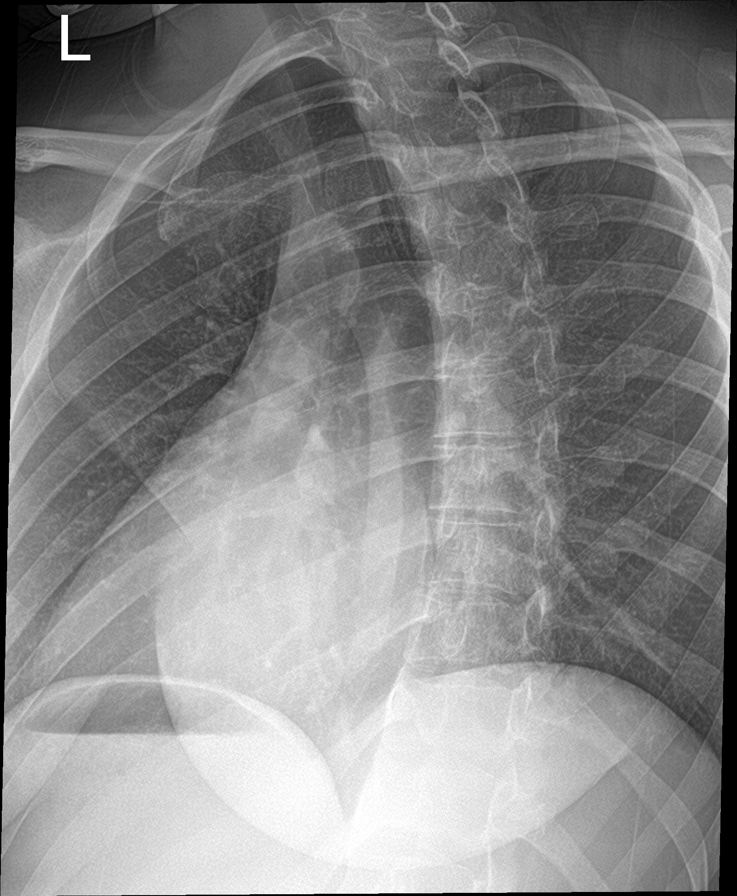

[sternum lat]
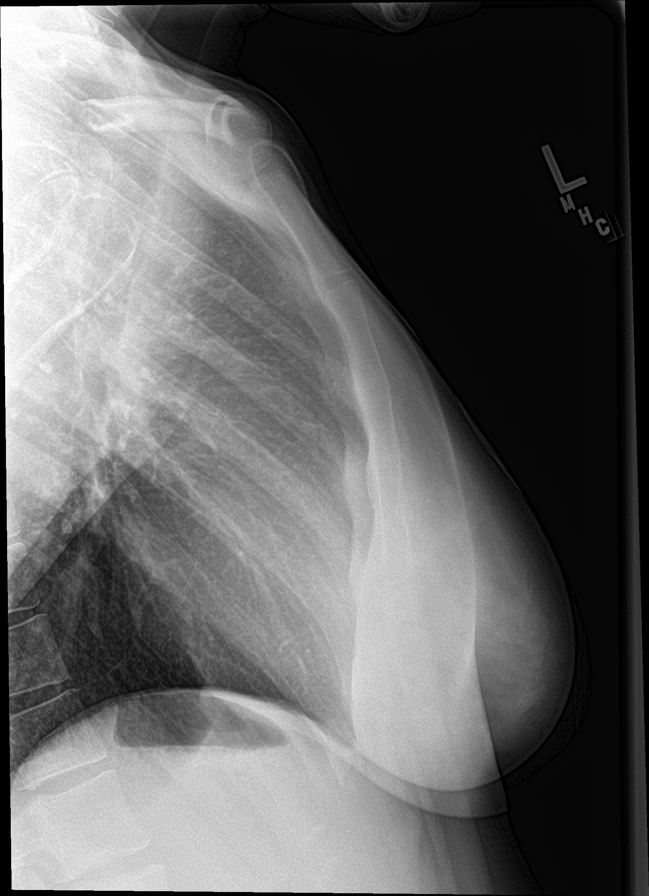

[3 of 3 positions shown; findings below may reference images not displayed]

FINDINGS: Normal contour of the sternum. No evidence of trauma or bony
abnormality otherwise.

Normal mediastinum and cardiac silhouette. Normal pulmonary
vasculature. No evidence of effusion, infiltrate, or pneumothorax.
No acute bony abnormality.
IMPRESSION: Normal chest radiograph and sternum.

## 2021-02-10 DIAGNOSIS — Z113 Encounter for screening for infections with a predominantly sexual mode of transmission: Secondary | ICD-10-CM | POA: Diagnosis not present

## 2021-02-10 DIAGNOSIS — Z3042 Encounter for surveillance of injectable contraceptive: Secondary | ICD-10-CM | POA: Diagnosis not present
